# Patient Record
Sex: Female | Born: 1985 | Race: White | Hispanic: No | Marital: Married | Smoking: Never smoker
Health system: Southern US, Community
[De-identification: ages and names within clinical notes are randomized; demographics above are authoritative.]

## PROBLEM LIST (undated history)

## (undated) DIAGNOSIS — F32A Depression, unspecified: Secondary | ICD-10-CM

## (undated) DIAGNOSIS — E785 Hyperlipidemia, unspecified: Secondary | ICD-10-CM

## (undated) DIAGNOSIS — F329 Major depressive disorder, single episode, unspecified: Secondary | ICD-10-CM

## (undated) DIAGNOSIS — G43909 Migraine, unspecified, not intractable, without status migrainosus: Secondary | ICD-10-CM

## (undated) DIAGNOSIS — F419 Anxiety disorder, unspecified: Secondary | ICD-10-CM

## (undated) HISTORY — PX: EXTERNAL EAR SURGERY: SHX627

## (undated) HISTORY — DX: Anxiety disorder, unspecified: F41.9

## (undated) HISTORY — DX: Depression, unspecified: F32.A

## (undated) HISTORY — DX: Major depressive disorder, single episode, unspecified: F32.9

## (undated) HISTORY — PX: ADENOIDECTOMY: SUR15

---

## 2008-07-07 ENCOUNTER — Emergency Department (HOSPITAL_COMMUNITY): Admission: EM | Admit: 2008-07-07 | Discharge: 2008-07-07 | Payer: Self-pay | Admitting: Family Medicine

## 2008-08-02 ENCOUNTER — Other Ambulatory Visit: Admission: RE | Admit: 2008-08-02 | Discharge: 2008-08-02 | Payer: Self-pay | Admitting: Obstetrics and Gynecology

## 2008-08-23 ENCOUNTER — Encounter: Admission: RE | Admit: 2008-08-23 | Discharge: 2008-09-11 | Payer: Self-pay | Admitting: Nurse Practitioner

## 2009-05-15 ENCOUNTER — Encounter: Admission: RE | Admit: 2009-05-15 | Discharge: 2009-06-04 | Payer: Self-pay | Admitting: Family Medicine

## 2009-10-02 ENCOUNTER — Other Ambulatory Visit: Admission: RE | Admit: 2009-10-02 | Discharge: 2009-10-02 | Payer: Self-pay | Admitting: Family Medicine

## 2010-08-08 LAB — GC/CHLAMYDIA PROBE AMP, GENITAL
Chlamydia, DNA Probe: NEGATIVE
GC Probe Amp, Genital: NEGATIVE

## 2010-08-08 LAB — POCT PREGNANCY, URINE: Preg Test, Ur: NEGATIVE

## 2010-08-08 LAB — WET PREP, GENITAL

## 2010-10-16 ENCOUNTER — Other Ambulatory Visit (HOSPITAL_COMMUNITY)
Admission: RE | Admit: 2010-10-16 | Discharge: 2010-10-16 | Disposition: A | Discharge status: Home / Self Care | Payer: 59 | Source: Ambulatory Visit | Attending: Family Medicine | Admitting: Family Medicine

## 2010-10-16 ENCOUNTER — Other Ambulatory Visit: Payer: Self-pay | Admitting: Family Medicine

## 2010-10-16 DIAGNOSIS — Z124 Encounter for screening for malignant neoplasm of cervix: Secondary | ICD-10-CM | POA: Insufficient documentation

## 2011-04-13 ENCOUNTER — Emergency Department (HOSPITAL_COMMUNITY)
Admission: EM | Admit: 2011-04-13 | Discharge: 2011-04-13 | Disposition: A | Discharge status: Home / Self Care | Payer: Worker's Compensation | Attending: Emergency Medicine | Admitting: Emergency Medicine

## 2011-04-13 ENCOUNTER — Encounter: Payer: Self-pay | Admitting: Emergency Medicine

## 2011-04-13 DIAGNOSIS — IMO0001 Reserved for inherently not codable concepts without codable children: Secondary | ICD-10-CM | POA: Insufficient documentation

## 2011-04-13 DIAGNOSIS — Y990 Civilian activity done for income or pay: Secondary | ICD-10-CM | POA: Insufficient documentation

## 2011-04-13 DIAGNOSIS — S61209A Unspecified open wound of unspecified finger without damage to nail, initial encounter: Secondary | ICD-10-CM | POA: Insufficient documentation

## 2011-04-13 DIAGNOSIS — IMO0002 Reserved for concepts with insufficient information to code with codable children: Secondary | ICD-10-CM

## 2011-04-13 DIAGNOSIS — Y9269 Other specified industrial and construction area as the place of occurrence of the external cause: Secondary | ICD-10-CM | POA: Insufficient documentation

## 2011-04-13 HISTORY — DX: Migraine, unspecified, not intractable, without status migrainosus: G43.909

## 2011-04-13 MED ORDER — DOXYCYCLINE HYCLATE 100 MG PO TABS
100.0000 mg | ORAL_TABLET | Freq: Once | ORAL | Status: AC
  Administered 2011-04-13: 100 mg via ORAL
  Filled 2011-04-13: qty 1

## 2011-04-13 MED ORDER — DOXYCYCLINE HYCLATE 100 MG PO TABS: 100.0000 mg | ORAL_TABLET | Freq: Two times a day (BID) | ORAL | Status: AC

## 2011-04-13 MED ORDER — FLUCONAZOLE 150 MG PO TABS: 150.0000 mg | ORAL_TABLET | Freq: Once | ORAL | Status: AC

## 2011-04-13 NOTE — ED Notes (Signed)
1/2 in scratch on inner aspect of 4th finger

## 2011-04-13 NOTE — ED Provider Notes (Signed)
History     CSN: [id number] Arrival date & time: 04/13/2011  9:00 AM   First MD Initiated Contact with Patient 04/13/11 0940      Chief Complaint  Patient presents with  . Animal Bite    cat bite 12 hrs ago, at work   HPI Patient presents to the emergency room with complaint of cat bite that started last night. Reports that she was bit by a cat that the owner believes his rabies shots are up to date. States that he was sent for testing as well. Patient has a prophylactic rabies shot last year. Denies any pain, swelling, warmth or other signs of infection. No fevers or chills. No other complaints.   Past Medical History  Diagnosis Date  . Migraine headache     Past Surgical History  Procedure Date  . Adenoidectomy     Family History  Problem Relation Age of Onset  . Diabetes Mother   . Hypertension Mother   . Migraines Mother     History  Substance Use Topics  . Smoking status: Never Smoker   . Smokeless tobacco: Not on file  . Alcohol Use: Yes    OB History    Grav Para Term Preterm Abortions TAB SAB Ect Mult Living                  Review of Systems  Constitutional: Negative for fever, chills, diaphoresis and appetite change.  HENT: Negative for neck pain.   Eyes: Negative for photophobia and visual disturbance.  Respiratory: Negative for cough, chest tightness and shortness of breath.   Cardiovascular: Negative for chest pain.  Gastrointestinal: Negative for nausea, vomiting and abdominal pain.  Genitourinary: Negative for flank pain.  Musculoskeletal: Negative for back pain.  Skin: Positive for wound. Negative for color change, pallor and rash.  Neurological: Negative for weakness and numbness.  All other systems reviewed and are negative.    Allergies  Amoxicillin and Latex  Home Medications   Current Outpatient Rx  Name Route Sig Dispense Refill  . ACETAMINOPHEN 500 MG PO TABS Oral Take 1,000 mg by mouth every 6 (six) hours as needed. For pain.      Marland Kitchen ETONOGESTREL-ETHINYL ESTRADIOL 0.12-0.015 MG/24HR VA RING Vaginal Place 1 each vaginally every 28 (twenty-eight) days. Insert vaginally and leave in place for 3 consecutive weeks, then remove for 1 week.     . IBUPROFEN 200 MG PO TABS Oral Take 400 mg by mouth every 6 (six) hours as needed. For pain.       BP 100/56  Pulse 66  Temp 98.2 F (36.8 C)  SpO2 100%  LMP 03/30/2011  Physical Exam  Nursing note and vitals reviewed. Constitutional: She is oriented to person, place, and time. She appears well-developed and well-nourished.  Non-toxic appearance. No distress.       Vital signs stable  HENT:  Head: Normocephalic and atraumatic.  Eyes: EOM are normal. Pupils are equal, round, and reactive to light.  Neck: Normal range of motion. Neck supple.  Cardiovascular: Normal rate, regular rhythm, normal heart sounds and intact distal pulses.  Exam reveals no gallop and no friction rub.   No murmur heard. Pulmonary/Chest: Effort normal and breath sounds normal. No respiratory distress. She has no wheezes.  Abdominal: Soft. Bowel sounds are normal. There is no tenderness. There is no rebound and no guarding.  Musculoskeletal: Normal range of motion. She exhibits no edema and no tenderness.  Neurological: She is alert and oriented to  person, place, and time. She exhibits normal muscle tone.  Skin: Skin is warm and dry. No rash noted. She is not diaphoretic.       Small 1/4 inch wound to the right fourth finger, bleeding controlled. No wound to repair, superficial. No signs of infection.  Psychiatric: She has a normal mood and affect. Her behavior is normal. Judgment and thought content normal.    ED Course  Procedures (including critical care time)  Patient seen and evaluated.  VSS reviewed. Nursing notes reviewed. Initial testing ordered. Will monitor the patient closely. They agree with the treatment plan and diagnosis.   No testing needed at this time. Patient advised to follow up  on the cat results for rabies. States that she would not like to be prophylactically treated for rabies at this time. Discussed risks and benefits. Is of sound mind to make this decision. Wound cleansed. Full sensation. Radial pulse intact. Cap refill < 2 seconds. No other complaints. Advised of warning signs to return.    MDM  Cat bite        Demetrius Charity, PA 04/13/11 1050

## 2011-04-13 NOTE — ED Provider Notes (Signed)
I have personally performed and participated in all the services and procedures documented herein. I have reviewed the findings with the patient.   Dione Booze, MD 04/13/11 [phone]

## 2011-04-13 NOTE — ED Notes (Signed)
Pt stated that she received a prophylactic rabies vacc. At work, one year ago

## 2011-04-17 ENCOUNTER — Encounter (HOSPITAL_COMMUNITY): Payer: Self-pay | Admitting: *Deleted

## 2011-04-17 ENCOUNTER — Emergency Department (HOSPITAL_COMMUNITY)
Admission: EM | Admit: 2011-04-17 | Discharge: 2011-04-17 | Disposition: A | Discharge status: Home / Self Care | Payer: Worker's Compensation | Attending: Emergency Medicine | Admitting: Emergency Medicine

## 2011-04-17 DIAGNOSIS — Z09 Encounter for follow-up examination after completed treatment for conditions other than malignant neoplasm: Secondary | ICD-10-CM | POA: Insufficient documentation

## 2011-04-17 DIAGNOSIS — W5501XA Bitten by cat, initial encounter: Secondary | ICD-10-CM

## 2011-04-17 DIAGNOSIS — Y990 Civilian activity done for income or pay: Secondary | ICD-10-CM | POA: Insufficient documentation

## 2011-04-17 DIAGNOSIS — IMO0001 Reserved for inherently not codable concepts without codable children: Secondary | ICD-10-CM | POA: Insufficient documentation

## 2011-04-17 NOTE — ED Notes (Addendum)
Pt was bitten by a cat on 12/15. Came to ED on 12/16, received treatment and were discharge with prescription antibiotics. Pt reports still taking antibiotics. Pt on 12/16 was instructed to come back for reevaluation on Tues 12/18, but pt was unable to, so pt came today to be reevaluated. Pt reports on 12/16 given prescription for Diflucan, but has not started taking it.

## 2011-04-17 NOTE — ED Provider Notes (Signed)
Medical screening examination/treatment/procedure(s) were performed by non-physician practitioner and as supervising physician I was immediately available for consultation/collaboration.    Nelia Shi, MD 04/17/11 2216

## 2011-04-17 NOTE — ED Provider Notes (Signed)
History     CSN: [id number]  Arrival date & time 04/17/11  4098   First MD Initiated Contact with Patient 04/17/11 [phone]      Chief Complaint  Patient presents with  . Wound Check    follow up on cat bite    (Consider location/radiation/quality/duration/timing/severity/associated sxs/prior treatment) Patient is a 25 y.o. female presenting with wound check. The history is provided by the patient.  Wound Check  She was treated in the ED 3 to 5 days ago. Treatments since wound repair include oral antibiotics. The maximum temperature noted was less than 100.4 F. There has been no drainage from the wound. There is no redness present. There is no swelling present. The pain has no pain.  Pt had a cat bite to the left ring finger 4 days ago. Is on antibiotics right now. Bite healed. No redness, swelling, drainage, fever. Was told to come for recheck.  Past Medical History  Diagnosis Date  . Migraine headache   . Asthma     exercise induced    Past Surgical History  Procedure Date  . Adenoidectomy     Family History  Problem Relation Age of Onset  . Diabetes Mother   . Hypertension Mother   . Migraines Mother   . Migraines Brother     History  Substance Use Topics  . Smoking status: Never Smoker   . Smokeless tobacco: Not on file  . Alcohol Use: Yes     occasionally    OB History    Grav Para Term Preterm Abortions TAB SAB Ect Mult Living                  Review of Systems  Constitutional: Negative for fever and chills.  HENT: Negative.   Respiratory: Negative.   Cardiovascular: Negative.   Gastrointestinal: Negative.   Genitourinary: Negative.   Musculoskeletal: Negative.   Skin: Positive for wound.  Neurological: Negative.   Psychiatric/Behavioral: Negative.     Allergies  Amoxicillin and Latex  Home Medications   Current Outpatient Rx  Name Route Sig Dispense Refill  . ACETAMINOPHEN 500 MG PO TABS Oral Take 1,000 mg by mouth every 6 (six) hours as  needed. For pain.     Marland Kitchen DOXYCYCLINE HYCLATE 100 MG PO TABS Oral Take 1 tablet (100 mg total) by mouth 2 (two) times daily. 10 tablet 0  . ETONOGESTREL-ETHINYL ESTRADIOL 0.12-0.015 MG/24HR VA RING Vaginal Place 1 each vaginally every 28 (twenty-eight) days. Insert vaginally and leave in place for 3 consecutive weeks, then remove for 1 week.     . IBUPROFEN 200 MG PO TABS Oral Take 400-600 mg by mouth every 8 (eight) hours as needed. For pain.    Marland Kitchen BLINK TEARS OP Ophthalmic Apply 1 drop to eye as needed. For dry eyes       BP 108/60  Pulse 86  Temp(Src) 98.4 F (36.9 C) (Oral)  Resp 16  Ht 4\' 11"  (1.499 m)  Wt 145 lb (65.772 kg)  BMI 29.29 kg/m2  SpO2 100%  LMP 03/30/2011  Physical Exam  Constitutional: She is oriented to person, place, and time. She appears well-developed and well-nourished. No distress.  Neck: Neck supple.  Cardiovascular: Normal rate, regular rhythm and normal heart sounds.   Pulmonary/Chest: Effort normal and breath sounds normal.  Neurological: She is alert and oriented to person, place, and time.  Skin: Skin is warm and dry.       Healed laceration to the left ring finger,  no swelling, tenderness, erythema, draiange  Psychiatric: She has a normal mood and affect.    ED Course  Procedures (including critical care time)  Pt with cat bite 4 days ago. Was told to return 2 days ago for recheck, but she is 2 days late. At this time the bite/wound is completely healed with no signs of infection. Will d/c home.   MDM          Lottie Mussel, PA 04/17/11 [phone]

## 2011-12-06 ENCOUNTER — Emergency Department
Admission: EM | Admit: 2011-12-06 | Discharge: 2011-12-06 | Disposition: A | Discharge status: Home / Self Care | Payer: 59 | Source: Home / Self Care

## 2011-12-06 DIAGNOSIS — J069 Acute upper respiratory infection, unspecified: Secondary | ICD-10-CM

## 2011-12-06 HISTORY — DX: Hyperlipidemia, unspecified: E78.5

## 2011-12-06 MED ORDER — AZITHROMYCIN 250 MG PO TABS: ORAL_TABLET | ORAL | Status: AC

## 2011-12-06 MED ORDER — BENZONATATE 100 MG PO CAPS: 100.0000 mg | ORAL_CAPSULE | Freq: Three times a day (TID) | ORAL | Status: AC | PRN

## 2011-12-06 NOTE — ED Notes (Signed)
States symptoms started 3 weeks ago, got better for a while now having increase coughing and head congestion

## 2011-12-06 NOTE — ED Provider Notes (Signed)
History     CSN: [id number]  Arrival date & time 12/06/11  1050   First MD Initiated Contact with Patient 12/06/11 1055      Chief Complaint  Patient presents with  . URI   HPI URI Symptoms Onset: 1 week Description: rhinorrhea, nasal congestion, post nasal drip, cough Modifying factors:  Was sick with similar sxs 2 weeks   Symptoms Nasal discharge: yes Fever: no Sore throat: no Cough: yes Wheezing: no Ear pain: mild GI symptoms: no Sick contacts: unknown  Red Flags  Stiff neck: no Dyspnea: no Rash: no Swallowing difficulty: no  Sinusitis Risk Factors Headache/face pain: no Double sickening: possible tooth pain: no  Allergy Risk Factors Sneezing: mild Itchy scratchy throat: mild Seasonal symptoms: no  Flu Risk Factors Headache: no muscle aches: no severe fatigue: no   Past Medical History  Diagnosis Date  . Migraine headache   . Asthma     exercise induced  . Hyperlipidemia     Past Surgical History  Procedure Date  . Adenoidectomy     Family History  Problem Relation Age of Onset  . Diabetes Mother   . Hypertension Mother   . Migraines Mother   . Migraines Brother     History  Substance Use Topics  . Smoking status: Never Smoker   . Smokeless tobacco: Not on file  . Alcohol Use: Yes     occasionally    OB History    Grav Para Term Preterm Abortions TAB SAB Ect Mult Living                  Review of Systems  All other systems reviewed and are negative.    Allergies  Amoxicillin and Latex  Home Medications   Current Outpatient Rx  Name Route Sig Dispense Refill  . ACETAMINOPHEN 500 MG PO TABS Oral Take 1,000 mg by mouth every 6 (six) hours as needed. For pain.     Marland Kitchen ETONOGESTREL-ETHINYL ESTRADIOL 0.12-0.015 MG/24HR VA RING Vaginal Place 1 each vaginally every 28 (twenty-eight) days. Insert vaginally and leave in place for 3 consecutive weeks, then remove for 1 week.     . IBUPROFEN 200 MG PO TABS Oral Take 400-600 mg by  mouth every 8 (eight) hours as needed. For pain.    Marland Kitchen BLINK TEARS OP Ophthalmic Apply 1 drop to eye as needed. For dry eyes       BP 105/75  Pulse 86  Temp 98.2 F (36.8 C) (Oral)  Resp 16  Ht 5' (1.524 m)  Wt 148 lb (67.132 kg)  BMI 28.90 kg/m2  SpO2 99%  LMP 11/12/2011  Physical Exam  Constitutional: She appears well-developed and well-nourished.  HENT:  Head: Normocephalic and atraumatic.  Right Ear: External ear normal.  Left Ear: External ear normal.       +nasal erythema, rhinorrhea bilaterally, + post oropharyngeal erythema    Eyes: Conjunctivae are normal. Pupils are equal, round, and reactive to light.  Neck: Normal range of motion. Neck supple.  Cardiovascular: Normal rate and regular rhythm.   Pulmonary/Chest: Effort normal and breath sounds normal.  Abdominal: Soft. Bowel sounds are normal.  Musculoskeletal: Normal range of motion.  Skin: Skin is warm.    ED Course  Procedures (including critical care time)  Labs Reviewed - No data to display No results found.   1. URI (upper respiratory infection)       MDM  Likely viral in etiology.  Given duration of sxs and overall  disease course, will treat with azithromycin for broad spectrum coverage.  Tessalon perles for cough.  Discussed infectious red flags.  Handout given.  Follow up as needed.      The patient and/or caregiver has been counseled thoroughly with regard to treatment plan and/or medications prescribed including dosage, schedule, interactions, rationale for use, and possible side effects and they verbalize understanding. Diagnoses and expected course of recovery discussed and will return if not improved as expected or if the condition worsens. Patient and/or caregiver verbalized understanding.             Floydene Flock, MD 12/06/11 1144  Floydene Flock, MD 12/06/11 1145

## 2011-12-11 NOTE — ED Provider Notes (Signed)
Agree with exam, assessment, and plan.   Lattie Haw, MD 12/11/11 [phone]

## 2012-01-01 ENCOUNTER — Other Ambulatory Visit (HOSPITAL_COMMUNITY)
Admission: RE | Admit: 2012-01-01 | Discharge: 2012-01-01 | Disposition: A | Discharge status: Home / Self Care | Payer: 59 | Source: Ambulatory Visit | Attending: Family Medicine | Admitting: Family Medicine

## 2012-01-01 ENCOUNTER — Other Ambulatory Visit: Payer: Self-pay | Admitting: Family Medicine

## 2012-01-01 DIAGNOSIS — Z124 Encounter for screening for malignant neoplasm of cervix: Secondary | ICD-10-CM | POA: Insufficient documentation

## 2012-11-27 ENCOUNTER — Emergency Department (HOSPITAL_COMMUNITY)
Admission: EM | Admit: 2012-11-27 | Discharge: 2012-11-27 | Disposition: A | Discharge status: Home / Self Care | Payer: 59 | Attending: Emergency Medicine | Admitting: Emergency Medicine

## 2012-11-27 ENCOUNTER — Emergency Department (HOSPITAL_COMMUNITY): Payer: 59

## 2012-11-27 ENCOUNTER — Encounter (HOSPITAL_COMMUNITY): Payer: Self-pay | Admitting: *Deleted

## 2012-11-27 DIAGNOSIS — R109 Unspecified abdominal pain: Secondary | ICD-10-CM | POA: Insufficient documentation

## 2012-11-27 DIAGNOSIS — Z79899 Other long term (current) drug therapy: Secondary | ICD-10-CM | POA: Insufficient documentation

## 2012-11-27 DIAGNOSIS — Z91040 Latex allergy status: Secondary | ICD-10-CM | POA: Insufficient documentation

## 2012-11-27 DIAGNOSIS — J45909 Unspecified asthma, uncomplicated: Secondary | ICD-10-CM | POA: Insufficient documentation

## 2012-11-27 DIAGNOSIS — R110 Nausea: Secondary | ICD-10-CM | POA: Insufficient documentation

## 2012-11-27 DIAGNOSIS — Z3202 Encounter for pregnancy test, result negative: Secondary | ICD-10-CM | POA: Insufficient documentation

## 2012-11-27 DIAGNOSIS — Z8679 Personal history of other diseases of the circulatory system: Secondary | ICD-10-CM | POA: Insufficient documentation

## 2012-11-27 DIAGNOSIS — Z9889 Other specified postprocedural states: Secondary | ICD-10-CM | POA: Insufficient documentation

## 2012-11-27 DIAGNOSIS — Z8639 Personal history of other endocrine, nutritional and metabolic disease: Secondary | ICD-10-CM | POA: Insufficient documentation

## 2012-11-27 DIAGNOSIS — Z862 Personal history of diseases of the blood and blood-forming organs and certain disorders involving the immune mechanism: Secondary | ICD-10-CM | POA: Insufficient documentation

## 2012-11-27 LAB — URINALYSIS W MICROSCOPIC + REFLEX CULTURE
Bilirubin Urine: NEGATIVE
Leukocytes, UA: NEGATIVE
Nitrite: NEGATIVE
Specific Gravity, Urine: 1.012 (ref 1.005–1.030)
Urobilinogen, UA: 0.2 mg/dL (ref 0.0–1.0)

## 2012-11-27 LAB — WET PREP, GENITAL
Clue Cells Wet Prep HPF POC: NONE SEEN
Trich, Wet Prep: NONE SEEN

## 2012-11-27 LAB — COMPREHENSIVE METABOLIC PANEL
Albumin: 4 g/dL (ref 3.5–5.2)
Alkaline Phosphatase: 57 U/L (ref 39–117)
BUN: 10 mg/dL (ref 6–23)
Calcium: 9.5 mg/dL (ref 8.4–10.5)
Creatinine, Ser: 0.53 mg/dL (ref 0.50–1.10)
GFR calc Af Amer: >90 mL/min (ref >90)
Glucose, Bld: 92 mg/dL (ref 70–99)
Total Protein: 6.7 g/dL (ref 6.0–8.3)

## 2012-11-27 LAB — PREGNANCY, URINE: Preg Test, Ur: NEGATIVE

## 2012-11-27 LAB — CBC WITH DIFFERENTIAL/PLATELET
Basophils Relative: 0 % (ref 0–1)
Eosinophils Absolute: 0.1 10*3/uL (ref 0.0–0.7)
Eosinophils Relative: 1 % (ref 0–5)
Hemoglobin: 12.1 g/dL (ref 12.0–15.0)
Lymphs Abs: 3.9 10*3/uL (ref 0.7–4.0)
MCH: 33.3 pg (ref 26.0–34.0)
MCHC: 34.4 g/dL (ref 30.0–36.0)
MCV: 97 fL (ref 78.0–100.0)
Monocytes Absolute: 0.4 10*3/uL (ref 0.1–1.0)
Monocytes Relative: 6 % (ref 3–12)
RBC: 3.63 MIL/uL — ABNORMAL LOW (ref 3.87–5.11)

## 2012-11-27 LAB — LIPASE, BLOOD: Lipase: 25 U/L (ref 11–59)

## 2012-11-27 MED ORDER — IOHEXOL 300 MG/ML  SOLN
50.0000 mL | Freq: Once | INTRAMUSCULAR | Status: AC | PRN
  Administered 2012-11-27: 50 mL via ORAL

## 2012-11-27 MED ORDER — IOHEXOL 300 MG/ML  SOLN
100.0000 mL | Freq: Once | INTRAMUSCULAR | Status: AC | PRN
  Administered 2012-11-27: 100 mL via INTRAVENOUS

## 2012-11-27 MED ORDER — DICYCLOMINE HCL 20 MG PO TABS: 20.0000 mg | ORAL_TABLET | Freq: Two times a day (BID) | ORAL | Status: DC

## 2012-11-27 NOTE — ED Notes (Signed)
Complains of abdominal pain that initiated on the right abdomen below the umbilicus that is now diffuse throughout the abdomen. Also complains of dizziness especially with movement. Acknowledges nausea but denies  Vomiting.

## 2012-11-27 NOTE — ED Provider Notes (Signed)
CSN: [id number]     Arrival date & time 11/27/12  1740 History     First MD Initiated Contact with Patient 11/27/12 1800     Chief Complaint  Patient presents with  . Abdominal Pain   (Consider location/radiation/quality/duration/timing/severity/associated sxs/prior Treatment) HPI Gloria Barnett is a 27 y.o. female who presents to ED with complaint of abdominal pain. States pain began 2 days ago. States began in the right side. Pain is now mainly in the lower abdomen, states "spread all over." Pt was seen by her PCP yesterday, had labs ordered, results of which she does not know. Also had [place] ordered to "r/o gall bladder, r/o appendicitis, r/o IUD placement." Pt states she was unable to get [place] because "system was down." States pain is now worsening. Describes pain as dull. Worsened with position and movement. Admits to nausea, no vomiting. NO fever. Normal appetite. No urinary or vaginal symptoms. Last bowel movement today, and normal.  Past Medical History  Diagnosis Date  . Migraine headache   . Asthma     exercise induced  . Hyperlipidemia    Past Surgical History  Procedure Laterality Date  . Adenoidectomy     Family History  Problem Relation Age of Onset  . Diabetes Mother   . Hypertension Mother   . Migraines Mother   . Migraines Brother    History  Substance Use Topics  . Smoking status: Never Smoker   . Smokeless tobacco: Not on file  . Alcohol Use: Yes     Comment: occasionally   OB History   Grav Para Term Preterm Abortions TAB SAB Ect Mult Living                 Review of Systems  Constitutional: Negative for fever and chills.  HENT: Negative for neck pain and neck stiffness.   Respiratory: Negative.   Cardiovascular: Negative.   Gastrointestinal: Positive for nausea and abdominal pain.  Genitourinary: Negative for dysuria, vaginal bleeding, vaginal discharge, difficulty urinating and pelvic pain.  Musculoskeletal: Negative for myalgias.  Skin: Negative.    Neurological: Negative for weakness and numbness.    Allergies  Amoxicillin and Latex  Home Medications   Current Outpatient Rx  Name  Route  Sig  Dispense  Refill  . albuterol (PROVENTIL HFA;VENTOLIN HFA) 108 (90 BASE) MCG/ACT inhaler   Inhalation   Inhale 2 puffs into the lungs every 6 (six) hours as needed for wheezing or shortness of breath.         . citalopram (CELEXA) 10 MG tablet   Oral   Take 10 mg by mouth daily.         Marland Kitchen ibuprofen (ADVIL,MOTRIN) 200 MG tablet   Oral   Take 400 mg by mouth every 8 (eight) hours as needed for pain.          . montelukast (SINGULAIR) 10 MG tablet   Oral   Take 10 mg by mouth every morning.          BP 115/73  Pulse 80  Temp(Src) 98.3 F (36.8 C) (Oral)  Resp 20  SpO2 100% Physical Exam  Nursing note and vitals reviewed. Constitutional: She is oriented to person, place, and time. She appears well-developed and well-nourished. No distress.  Eyes: Conjunctivae are normal.  Neck: Neck supple.  Cardiovascular: Normal rate, regular rhythm and normal heart sounds.   Pulmonary/Chest: Effort normal and breath sounds normal. No respiratory distress. She has no wheezes. She has no rales.  Abdominal: Soft.  Bowel sounds are normal. She exhibits no distension. There is tenderness. There is no rebound and no guarding.  RLQ, LLQ, suprapubic tenderness  Genitourinary:  Normal external genitalia. Normal vaginal canal, white thin discharge. Normal cervix, IUD strings seen. No CMT, no adnexal tenderness. No uterine tenderness.   Musculoskeletal: She exhibits no edema.  Neurological: She is alert and oriented to person, place, and time.  Skin: Skin is warm and dry.  Psychiatric: She has a normal mood and affect. Her behavior is normal.    ED Course   Procedures (including critical care time)  Results for orders placed during the hospital encounter of 11/27/12  WET PREP, GENITAL      Result Value Range   Yeast Wet Prep HPF POC  NONE SEEN  NONE SEEN   Trich, Wet Prep NONE SEEN  NONE SEEN   Clue Cells Wet Prep HPF POC NONE SEEN  NONE SEEN   WBC, Wet Prep HPF POC FEW (*) NONE SEEN  CBC WITH DIFFERENTIAL      Result Value Range   WBC 6.5  4.0 - 10.5 K/uL   RBC 3.63 (*) 3.87 - 5.11 MIL/uL   Hemoglobin 12.1  12.0 - 15.0 g/dL   HCT 91.4 (*) 78.2 - 95.6 %   MCV 97.0  78.0 - 100.0 fL   MCH 33.3  26.0 - 34.0 pg   MCHC 34.4  30.0 - 36.0 g/dL   RDW 21.3  08.6 - 57.8 %   Platelets 198  150 - 400 K/uL   Neutrophils Relative % 33 (*) 43 - 77 %   Neutro Abs 2.1  1.7 - 7.7 K/uL   Lymphocytes Relative 60 (*) 12 - 46 %   Lymphs Abs 3.9  0.7 - 4.0 K/uL   Monocytes Relative 6  3 - 12 %   Monocytes Absolute 0.4  0.1 - 1.0 K/uL   Eosinophils Relative 1  0 - 5 %   Eosinophils Absolute 0.1  0.0 - 0.7 K/uL   Basophils Relative 0  0 - 1 %   Basophils Absolute 0.0  0.0 - 0.1 K/uL  COMPREHENSIVE METABOLIC PANEL      Result Value Range   Sodium 134 (*) 135 - 145 mEq/L   Potassium 3.6  3.5 - 5.1 mEq/L   Chloride 101  96 - 112 mEq/L   CO2 24  19 - 32 mEq/L   Glucose, Bld 92  70 - 99 mg/dL   BUN 10  6 - 23 mg/dL   Creatinine, Ser 4.69  0.50 - 1.10 mg/dL   Calcium 9.5  8.4 - 62.9 mg/dL   Total Protein 6.7  6.0 - 8.3 g/dL   Albumin 4.0  3.5 - 5.2 g/dL   AST 15  0 - 37 U/L   ALT 12  0 - 35 U/L   Alkaline Phosphatase 57  39 - 117 U/L   Total Bilirubin 0.3  0.3 - 1.2 mg/dL   GFR calc non Af Amer >90  >90 mL/min   GFR calc Af Amer >90  >90 mL/min  LIPASE, BLOOD      Result Value Range   Lipase 25  11 - 59 U/L  URINALYSIS W MICROSCOPIC + REFLEX CULTURE      Result Value Range   Color, Urine YELLOW  YELLOW   APPearance CLEAR  CLEAR   Specific Gravity, Urine 1.012  1.005 - 1.030   pH 6.0  5.0 - 8.0   Glucose, UA NEGATIVE  NEGATIVE  mg/dL   Hgb urine dipstick NEGATIVE  NEGATIVE   Bilirubin Urine NEGATIVE  NEGATIVE   Ketones, ur NEGATIVE  NEGATIVE mg/dL   Protein, ur NEGATIVE  NEGATIVE mg/dL   Urobilinogen, UA 0.2  0.0 - 1.0  mg/dL   Nitrite NEGATIVE  NEGATIVE   Leukocytes, UA NEGATIVE  NEGATIVE   Urine-Other       Value: NO FORMED ELEMENTS SEEN ON URINE MICROSCOPIC EXAMINATION  PREGNANCY, URINE      Result Value Range   Preg Test, Ur NEGATIVE  NEGATIVE   Ct Abdomen Pelvis W Contrast  11/27/2012   *RADIOLOGY REPORT*  Clinical Data:   Abdominal pain.  CT ABDOMEN AND PELVIS WITH CONTRAST  Technique:  Multidetector CT imaging of the abdomen and pelvis was performed following the standard protocol during bolus administration of intravenous contrast.  Contrast: OMNIPAQUE IOHEXOL 300 MG/ML  SOLN  Comparison: None.  Findings:  BODY WALL: Unremarkable.  LOWER CHEST:  Mediastinum: Unremarkable.  Lungs/pleura: No consolidation.  ABDOMEN/PELVIS:  Liver: Incidental sub centimeter subcapsular low attenuation focus along segment II, fat infiltration around the falciform ligament.  Biliary: No evidence of biliary obstruction or stone.  Pancreas: Unremarkable.  Spleen: Unremarkable.  Adrenals: Unremarkable.  Kidneys and ureters: No hydronephrosis or stone.  Bladder: Unremarkable.  Bowel: No obstruction. Normal appendix. Minimal fat haziness around the cecum, is likely within normal limits given the cecum is otherwise unremarkable.  Retroperitoneum: No mass or adenopathy.  Peritoneum: Small-volume free fluid in the pelvis, measuring water density.  Reproductive: Right-sided corpus luteal cyst. There is an IUD, with crossbar 11 mm from the from the serosal fundus.  This side arms are fully deployed and oriented appropriately.  The right side arm extends to within 3 mm of the myometrial surface.  Vascular: No acute abnormality.  OSSEOUS: No acute abnormalities. No suspicious lytic or blastic lesions.  IMPRESSION: 1.  No acute intra-abdominal findings.  No appendicitis. 2.  Free pelvic fluid, usually physiologic.   Original Report Authenticated By: Tiburcio Pea     1. Abdominal pain     MDM  PT with abdominal pain for 3 days,  diffuse, lower abdomen. Exam positive for lower abdominal tenderness with no guarding, no rebound tenderness. Pelvic exam unremarkable. Urine preg negative. UA negative. Pt has no RUQ tenderness. Given exam and 2nd visit for same pain, CT obtained to r/o appendicitis. CT is negative. Pt's VS normal. She does not have an acute abdomen on exam. She is non toxic appearing. Will try bentyl, pain management at home. Follow up with PCP.   Filed Vitals:   11/27/12 1748 11/27/12 2249  BP: 115/73 103/64  Pulse: 80 67  Temp: 98.3 F (36.8 C)   TempSrc: Oral   Resp: 20 18  SpO2: 100% 100%     Ymani Porcher A Enslie Sahota, PA-C 11/28/12 0008

## 2012-11-27 NOTE — ED Notes (Signed)
Pt reports onset of abd pain yesterday. Pain was on right side of abd yesterday, pt saw her pcp at [facility] - ordered for pt to have ultrasound but computers were down. Pt's urinalysis was negative, cbc and cmp have not resulted. Pain has spread all over abd, pt c/o nausea. Ultrasound to r/o gallstone disease, appendicitis and evaluate IUD placement

## 2012-11-28 NOTE — ED Provider Notes (Signed)
Medical screening examination/treatment/procedure(s) were performed by non-physician practitioner and as supervising physician I was immediately available for consultation/collaboration.    Raschelle Wisenbaker R Sebasthian Stailey, MD 11/28/12 0018 

## 2013-05-06 ENCOUNTER — Encounter: Payer: Self-pay | Admitting: Neurology

## 2013-05-09 ENCOUNTER — Ambulatory Visit: Payer: 59 | Admitting: Neurology

## 2013-05-13 ENCOUNTER — Ambulatory Visit (INDEPENDENT_AMBULATORY_CARE_PROVIDER_SITE_OTHER): Payer: 59 | Admitting: Neurology

## 2013-05-13 ENCOUNTER — Encounter: Payer: Self-pay | Admitting: Neurology

## 2013-05-13 VITALS — BP 101/63 | HR 60 | Ht 62.0 in | Wt 154.0 lb

## 2013-05-13 DIAGNOSIS — G43909 Migraine, unspecified, not intractable, without status migrainosus: Secondary | ICD-10-CM | POA: Insufficient documentation

## 2013-05-13 DIAGNOSIS — F419 Anxiety disorder, unspecified: Secondary | ICD-10-CM | POA: Insufficient documentation

## 2013-05-13 DIAGNOSIS — F411 Generalized anxiety disorder: Secondary | ICD-10-CM

## 2013-05-13 DIAGNOSIS — E785 Hyperlipidemia, unspecified: Secondary | ICD-10-CM | POA: Insufficient documentation

## 2013-05-13 MED ORDER — NORTRIPTYLINE HCL 10 MG PO CAPS: ORAL_CAPSULE | ORAL | Status: DC

## 2013-05-13 MED ORDER — RIZATRIPTAN BENZOATE 10 MG PO TBDP: 10.0000 mg | ORAL_TABLET | ORAL | Status: AC | PRN

## 2013-05-13 NOTE — Progress Notes (Signed)
[facility]  PATIENT: Gloria Barnett DOB: 1985/12/10  HISTORICAL  Gloria Barnett is a 28 year old right-handed female, was referred by her primary care physician Dr. Cleda Mccreedy, and ENT Dr. Pollyann Kennedy for evaluation of headaches.  She has past medical history of migraines since college, her typical migraines are bilateral retro-orbital area severe pounding headaches, was associated light noise sensitivity, worsening by movement, lasting 8-12 hours, getting better by lying down in dark quiet room,  Trigger for her migraines are stress, sleep deprivation, hungry  Her mother, and brother suffer jiggle migraines as well  She has changed to a shift to work since April 2014, she began to have frequent headaches since then, she works from midnight to 9 AM, she is not having 2-3 severe migraine headaches each months   Topamax was helping her headaches, but she complains of side effect, such as slow reaction, numbness tingling of her fingertips, tasted funny  REVIEW OF SYSTEMS: Full 14 system review of systems performed and notable only for fatigue, eye pain, cough, headaches, energy, insomnia, sleepiness, as she to work, depression, anxiety, not enough sleep, decreased energy  ALLERGIES: Allergies  Allergen Reactions  . Amoxicillin Rash  . Latex Rash    HOME MEDICATIONS: Outpatient Prescriptions Prior to Visit  Medication Sig Dispense Refill  . albuterol (PROVENTIL HFA;VENTOLIN HFA) 108 (90 BASE) MCG/ACT inhaler Inhale 2 puffs into the lungs every 6 (six) hours as needed for wheezing or shortness of breath.      . citalopram (CELEXA) 10 MG tablet Take 10 mg by mouth daily.      Marland Kitchen ibuprofen (ADVIL,MOTRIN) 200 MG tablet Take 400 mg by mouth every 8 (eight) hours as needed for pain.       . Cetirizine HCl (ZYRTEC ALLERGY PO) Take by mouth.      . dicyclomine (BENTYL) 20 MG tablet Take 1 tablet (20 mg total) by mouth 2 (two) times daily.  20 tablet  0  . montelukast (SINGULAIR) 10  MG tablet Take 10 mg by mouth every morning.         PAST MEDICAL HISTORY: Past Medical History  Diagnosis Date  . Migraine headache   . Asthma     exercise induced  . Hyperlipidemia   . Depression   . Anxiety     PAST SURGICAL HISTORY: Past Surgical History  Procedure Laterality Date  . Adenoidectomy    . External ear surgery      FAMILY HISTORY: Family History  Problem Relation Age of Onset  . Diabetes Maternal Grandmother   . Hypertension Maternal Grandmother   . Migraines Mother   . Migraines Brother   . Heart disease      grandmother, cardiac disorder  . Osteoporosis      grandmother    SOCIAL HISTORY:  History   Social History  . Marital Status: Married    Spouse Name: Gloria Barnett    Number of Children: 0  . Years of Education: BS   Occupational History    [employer],    Social History Main Topics  . Smoking status: Never Smoker   . Smokeless tobacco: Never Used  . Alcohol Use: 0.0 oz/week     Comment: 2 drinks daily or fewer  . Drug Use: No  . Sexual Activity: Yes    Birth Control/ Protection: IUD   Other Topics Concern  . Not on file   Social History Narrative   Patient lives at home withher husband Gloria Barnett)   Patient works full time  [employer]   Education college - BS   Caffeine consumption is 2 cups daily   Right handed.     PHYSICAL EXAM   Filed Vitals:   05/13/13 1015  BP: 101/63  Pulse: 60  Height: 5\' 2"  (1.575 m)  Weight: 154 lb (69.854 kg)     Body mass index is 28.16 kg/(m^2).   Generalized: In no acute distress  Neck: Supple, no carotid bruits   Cardiac: Regular rate rhythm  Pulmonary: Clear to auscultation bilaterally  Musculoskeletal: No deformity  Neurological examination  Mentation: Alert oriented to time, place, history taking, and causual conversation  Cranial nerve II-XII: Pupils were equal round reactive to light extraocular movements were full, Visual field were full on confrontational  test. Bilateral fundi were sharp.  Facial sensation and strength were normal. Hearing was intact to finger rubbing bilaterally. Uvula tongue midline.  head turning and shoulder shrug and were normal and symmetric.Tongue protrusion into cheek strength was normal.  Motor: normal tone, bulk and strength.  Sensory: Intact to fine touch, pinprick, preserved vibratory sensation, and proprioception at toes.  Coordination: Normal finger to nose, heel-to-shin bilaterally there was no truncal ataxia  Gait: Rising up from seated position without assistance, normal stance, without trunk ataxia, moderate stride, good arm swing, smooth turning, able to perform tiptoe, and heel walking without difficulty.   Romberg signs: Negative  Deep tendon reflexes: Brachioradialis 2/2, biceps 2/2, triceps 2/2, patellar 2/2, Achilles 2/2, plantar responses were flexor bilaterally.   DIAGNOSTIC DATA (LABS, IMAGING, TESTING) - I reviewed patient records, labs, notes, testing and imaging myself where available.  Lab Results  Component Value Date   WBC 6.5 11/27/2012   HGB 12.1 11/27/2012   HCT 35.2* 11/27/2012   MCV 97.0 11/27/2012   PLT 198 11/27/2012      Component Value Date/Time   NA 134* 11/27/2012 1839   K 3.6 11/27/2012 1839   CL 101 11/27/2012 1839   CO2 24 11/27/2012 1839   GLUCOSE 92 11/27/2012 1839   BUN 10 11/27/2012 1839   CREATININE 0.53 11/27/2012 1839   CALCIUM 9.5 11/27/2012 1839   PROT 6.7 11/27/2012 1839   ALBUMIN 4.0 11/27/2012 1839   AST 15 11/27/2012 1839   ALT 12 11/27/2012 1839   ALKPHOS 57 11/27/2012 1839   BILITOT 0.3 11/27/2012 1839   GFRNONAA >90 11/27/2012 1839   GFRAA >90 11/27/2012 1839    ASSESSMENT AND PLAN   28 years old right-handed female, presenting with frequent migraines, likely due to her shift work, could not tolerate Topamax due to the side effect   1. nortriptyline 10 mg every night titrating 20 mg every night as preventive medications 2 Maxalt as needed 3. Return to clinic with Gloria Barnett in 3-4  months       Levert FeinsteinYijun Quasean Frye, M.D. Ph.D.  [facility] [address], [address] [place], [place] [address] [phone]

## 2013-08-30 ENCOUNTER — Encounter: Payer: Self-pay | Admitting: Nurse Practitioner

## 2013-08-30 ENCOUNTER — Ambulatory Visit (INDEPENDENT_AMBULATORY_CARE_PROVIDER_SITE_OTHER): Payer: 59 | Admitting: Nurse Practitioner

## 2013-08-30 VITALS — BP 102/66 | HR 73 | Ht 60.5 in | Wt 162.0 lb

## 2013-08-30 DIAGNOSIS — F411 Generalized anxiety disorder: Secondary | ICD-10-CM

## 2013-08-30 DIAGNOSIS — F419 Anxiety disorder, unspecified: Secondary | ICD-10-CM

## 2013-08-30 DIAGNOSIS — G43909 Migraine, unspecified, not intractable, without status migrainosus: Secondary | ICD-10-CM

## 2013-08-30 NOTE — Patient Instructions (Signed)
Continue nortriptyline 20 mg at bedtime Given the information on foods and  other triggers for migraine Given general information on keeping a diary, stress release techniques etc. Followup in 6-8 months

## 2013-08-30 NOTE — Progress Notes (Signed)
[facility]  PATIENT: Gloria Barnett DOB: 10/28/1985   REASON FOR VISIT: Followup for migraine   HISTORY OF PRESENT ILLNESS: Gloria Barnett, 28 year old female returns for followup. She has a history of migraines and was initially evaluated by Dr. Terrace Barnett 05/14/2011. She was placed on nortriptyline at that time and has done well. She also stopped her Tylenol over-the-counter. She has Maxalt to take prn. She is not aware of any specific triggers. There has been no change in pattern of her headaches. She returns for reevaluation   HISTORY:  Migraines since college, her typical migraines are bilateral retro-orbital area severe pounding headaches, was associated light noise sensitivity, worsening by movement, lasting 8-12 hours, getting better by lying down in dark quiet room,  Trigger for her migraines are stress, sleep deprivation, hungry  Her mother, and brother suffer jiggle migraines as well  She has changed to a shift to work since April 2014, she began to have frequent headaches since then, she works from midnight to 9 AM, she is not having 2-3 severe migraine headaches each months  Topamax was helping her headaches, but she complains of side effect, such as slow reaction, numbness tingling of her fingertips, tasted funny  REVIEW OF SYSTEMS: Full 14 system review of systems performed and notable only for those listed, all others are neg:  Constitutional: N/A  Cardiovascular: N/A  Ear/Nose/Throat: N/A  Skin: N/A  Eyes: N/A  Respiratory: N/A  Gastroitestinal: N/A  Hematology/Lymphatic: N/A  Endocrine: N/A Musculoskeletal: Back pain  Allergy/Immunology: N/A  Neurological: Headache  Psychiatric: Anxiety, depression Sleep : NA   ALLERGIES: Allergies  Allergen Reactions  . Amoxicillin Rash  . Latex Rash    HOME MEDICATIONS: Outpatient Prescriptions Prior to Visit  Medication Sig Dispense Refill  . albuterol (PROVENTIL HFA;VENTOLIN HFA) 108 (90 BASE) MCG/ACT  inhaler Inhale 2 puffs into the lungs every 6 (six) hours as needed for wheezing or shortness of breath.      . citalopram (CELEXA) 10 MG tablet Take 10 mg by mouth daily.      Marland Kitchen. ibuprofen (ADVIL,MOTRIN) 200 MG tablet Take 400 mg by mouth every 8 (eight) hours as needed for pain.       . nortriptyline (PAMELOR) 10 MG capsule One po qhs xone week, then 2 tabs po qhs  60 capsule  12  . rizatriptan (MAXALT-MLT) 10 MG disintegrating tablet Take 1 tablet (10 mg total) by mouth as needed for migraine. May repeat in 2 hours if needed  15 tablet  12   No facility-administered medications prior to visit.    PAST MEDICAL HISTORY: Past Medical History  Diagnosis Date  . Migraine headache   . Asthma     exercise induced  . Hyperlipidemia   . Depression   . Anxiety     PAST SURGICAL HISTORY: Past Surgical History  Procedure Laterality Date  . Adenoidectomy    . External ear surgery      FAMILY HISTORY: Family History  Problem Relation Age of Onset  . Diabetes Maternal Grandmother   . Hypertension Maternal Grandmother   . Migraines Mother   . Migraines Brother   . Heart disease      grandmother, cardiac disorder  . Osteoporosis      grandmother    SOCIAL HISTORY: History   Social History  . Marital Status: Married    Spouse Name: Gloria Barnett    Number of Children: 0  . Years of Education: BS   Occupational History  .  [employer]   Social History Main Topics  . Smoking status: Never Smoker   . Smokeless tobacco: Never Used  . Alcohol Use: 0.0 oz/week     Comment: 2 drinks daily or fewer  . Drug Use: No  . Sexual Activity: Yes    Birth Control/ Protection: IUD   Other Topics Concern  . Not on file   Social History Narrative   Patient lives at home withher husband Gloria Hailey(Dust[place])   Patient works full time [employer]   Education college - BS   Caffeine consumption is 2 cups daily   Right handed.     PHYSICAL EXAM  Filed Vitals:   08/30/13 0957  BP:  102/66  Pulse: 73  Height: 5' 0.5" (1.537 m)  Weight: 162 lb (73.483 kg)   Body mass index is 31.11 kg/(m^2).  Generalized: Well developed, obese female in no acute distress  Head: normocephalic and atraumatic,. Oropharynx benign  Neck: Supple, no carotid bruits  Cardiac: Regular rate rhythm, no murmur  Musculoskeletal: No deformity   Neurological examination   Mentation: Alert oriented to time, place, history taking. Follows all commands speech and language fluent  Cranial nerve II-XII: Pupils were equal round reactive to light extraocular movements were full, visual field were full on confrontational test. Facial sensation and strength were normal. hearing was intact to finger rubbing bilaterally. Uvula tongue midline. head turning and shoulder shrug were normal and symmetric.Tongue protrusion into cheek strength was normal. Motor: normal bulk and tone, full strength in the BUE, BLE, fine finger movements normal, no pronator drift. No focal weakness Coordination: finger-nose-finger, heel-to-shin bilaterally, no dysmetria Reflexes: Brachioradialis 2/2, biceps 2/2, triceps 2/2, patellar 2/2, Achilles 2/2, plantar responses were flexor bilaterally. Gait and Station: Rising up from seated position without assistance, normal stance,  moderate stride, good arm swing, smooth turning, able to perform tiptoe, and heel walking without difficulty. Tandem gait is steady  DIAGNOSTIC DATA (LABS, IMAGING, TESTING) - I reviewed patient records, labs, notes, testing and imaging myself where available.  Lab Results  Component Value Date   WBC 6.5 11/27/2012   HGB 12.1 11/27/2012   HCT 35.2* 11/27/2012   MCV 97.0 11/27/2012   PLT 198 11/27/2012      Component Value Date/Time   NA 134* 11/27/2012 1839   K 3.6 11/27/2012 1839   CL 101 11/27/2012 1839   CO2 24 11/27/2012 1839   GLUCOSE 92 11/27/2012 1839   BUN 10 11/27/2012 1839   CREATININE 0.53 11/27/2012 1839   CALCIUM 9.5 11/27/2012 1839   PROT 6.7 11/27/2012 1839     ALBUMIN 4.0 11/27/2012 1839   AST 15 11/27/2012 1839   ALT 12 11/27/2012 1839   ALKPHOS 57 11/27/2012 1839   BILITOT 0.3 11/27/2012 1839   GFRNONAA >90 11/27/2012 1839   GFRAA >90 11/27/2012 1839      ASSESSMENT AND PLAN  28 y.o. year old female  has a past medical history of Migraine headache; Asthma; Hyperlipidemia; Depression; and Anxiety. here to followup for her headaches. She is currently doing well on nortriptyline 20 mg at bedtime. She is sleeping well   Continue nortriptyline 20 mg at bedtime, does not need refills Given the information on foods and  other triggers for migraine Given general information on keeping a diary, stress release techniques etc. Followup in 6-8 months Nilda RiggsNancy Carolyn Martin, [facility] CenterGNP, [facility], APRN  [facility] [address], [address] [place], [place] [address] [phone]

## 2014-03-01 ENCOUNTER — Other Ambulatory Visit: Payer: Self-pay | Admitting: Orthopedic Surgery

## 2014-03-01 DIAGNOSIS — M5442 Lumbago with sciatica, left side: Secondary | ICD-10-CM

## 2014-03-02 ENCOUNTER — Ambulatory Visit
Admission: RE | Admit: 2014-03-02 | Discharge: 2014-03-02 | Disposition: A | Discharge status: Home / Self Care | Payer: 59 | Source: Ambulatory Visit | Attending: Orthopedic Surgery | Admitting: Orthopedic Surgery

## 2014-03-02 ENCOUNTER — Ambulatory Visit: Payer: Self-pay | Admitting: Adult Health

## 2014-03-02 DIAGNOSIS — M5442 Lumbago with sciatica, left side: Secondary | ICD-10-CM

## 2014-03-08 ENCOUNTER — Other Ambulatory Visit: Payer: Self-pay | Admitting: Orthopedic Surgery

## 2014-03-08 DIAGNOSIS — M533 Sacrococcygeal disorders, not elsewhere classified: Principal | ICD-10-CM

## 2014-03-08 DIAGNOSIS — G8929 Other chronic pain: Secondary | ICD-10-CM

## 2014-03-15 ENCOUNTER — Ambulatory Visit
Admission: RE | Admit: 2014-03-15 | Discharge: 2014-03-15 | Disposition: A | Discharge status: Home / Self Care | Payer: 59 | Source: Ambulatory Visit | Attending: Orthopedic Surgery | Admitting: Orthopedic Surgery

## 2014-03-15 DIAGNOSIS — M533 Sacrococcygeal disorders, not elsewhere classified: Principal | ICD-10-CM

## 2014-03-15 DIAGNOSIS — G8929 Other chronic pain: Secondary | ICD-10-CM

## 2014-12-16 IMAGING — CT CT BIOPSY
2 series · 12 of 16 positions shown, 14 images · non-contrast
Comparison: none

CLINICAL DATA: Left sacroiliac joint pain.

[Series 2: si joint injection · axial · 0.70mm/px · z∈[-96,-62]mm · 8 of 52 slices shown, 10 images (1 of 2)]
[im 6/52  soft-tissue]
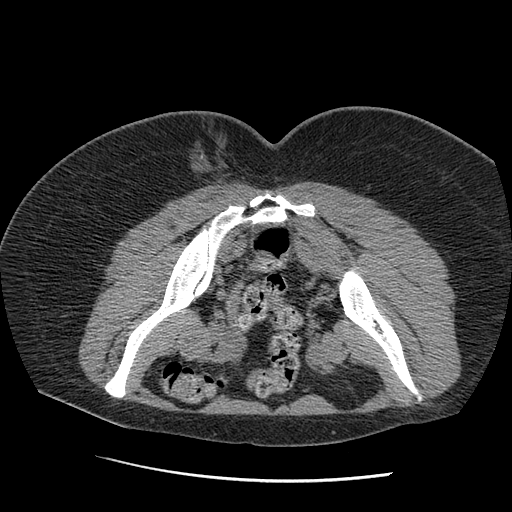
[im 6/52  bone]
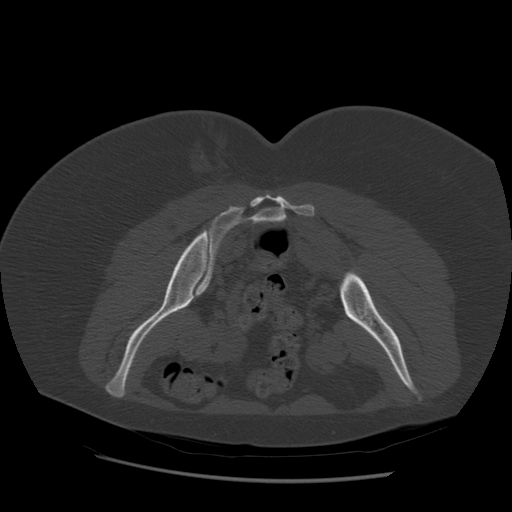
[im 12/52  soft-tissue]
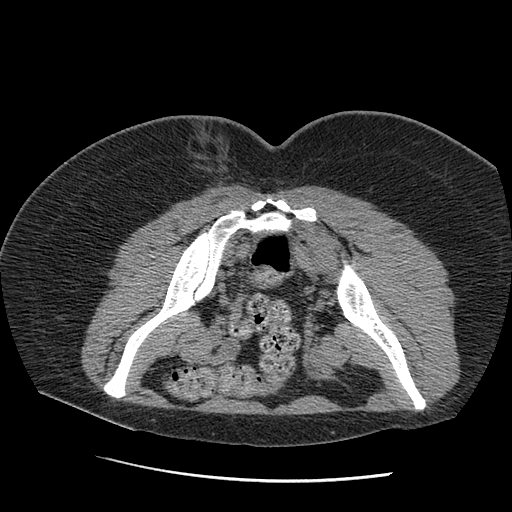
[im 18/52  soft-tissue]
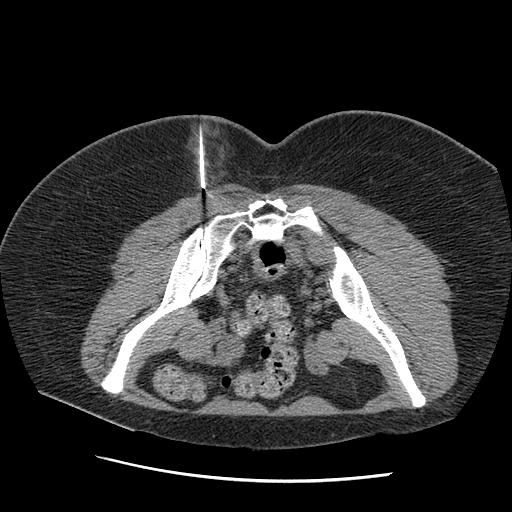
[im 23/52  soft-tissue]
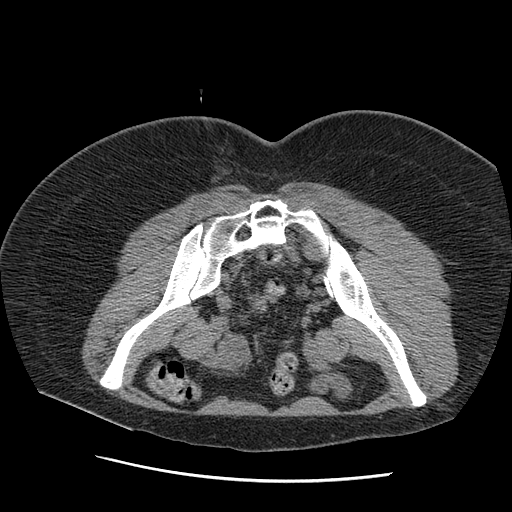
[im 29/52  soft-tissue]
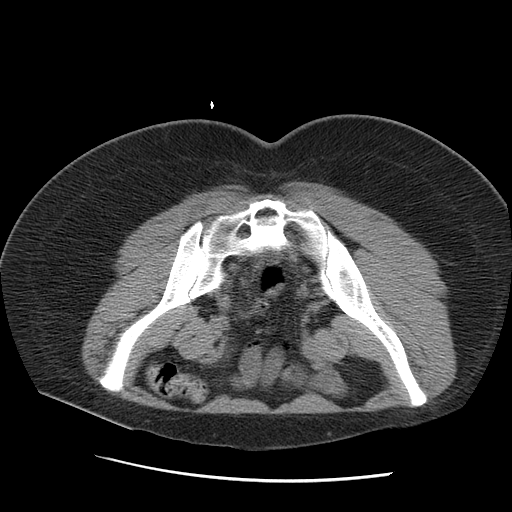
[im 29/52  bone]
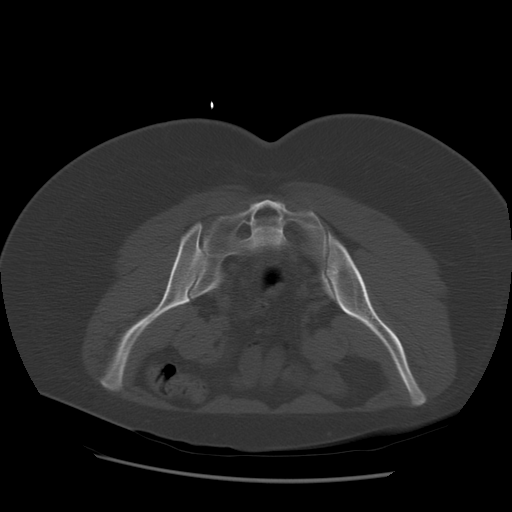
[im 35/52  soft-tissue]
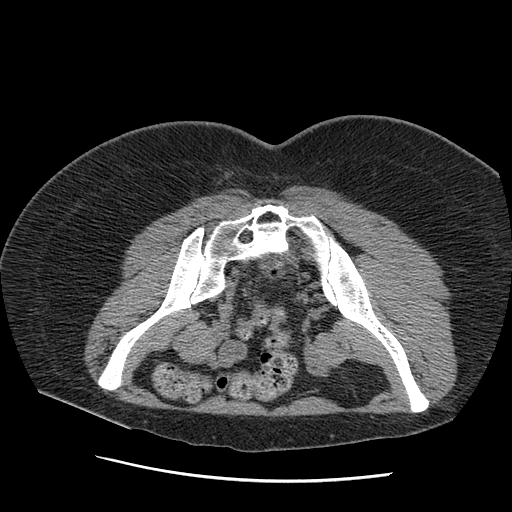
[im 40/52  soft-tissue]
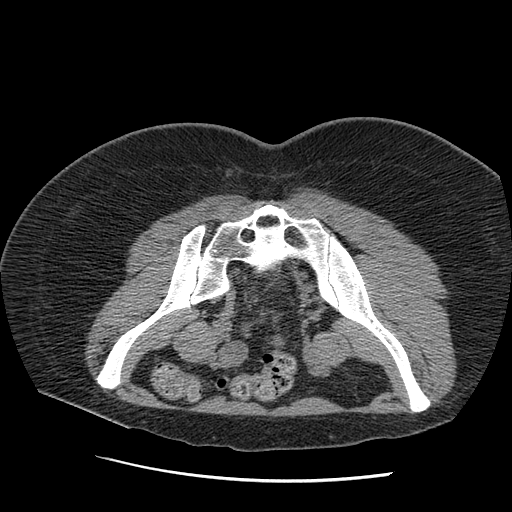
[im 46/52  soft-tissue]
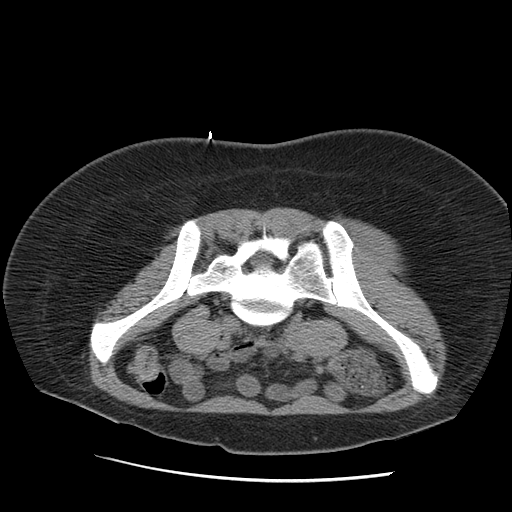

[Series 102: si joint injection · axial · 0.70mm/px · z∈[-97,-50]mm · 4 of 33 slices shown (2 of 2)]
[im 7/33  soft-tissue]
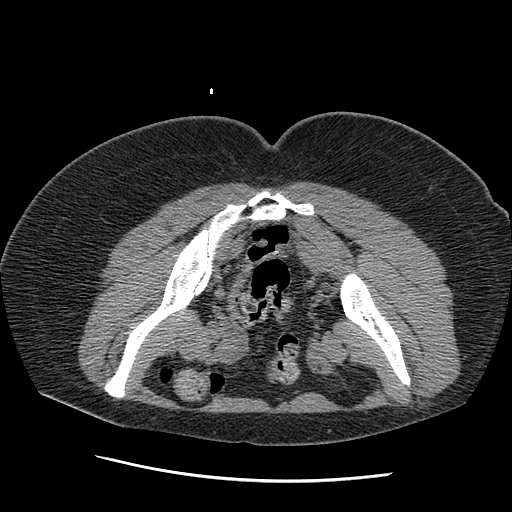
[im 13/33  soft-tissue]
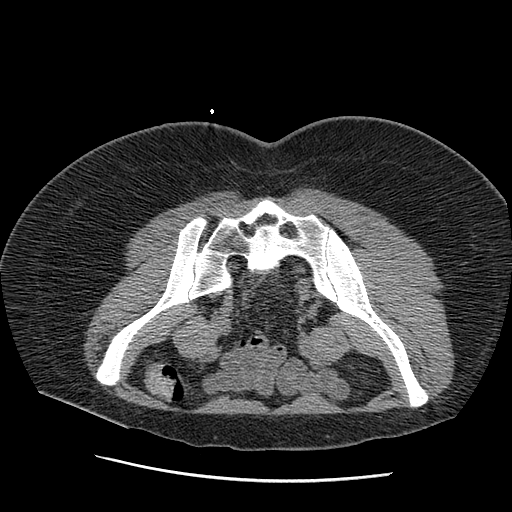
[im 20/33  soft-tissue]
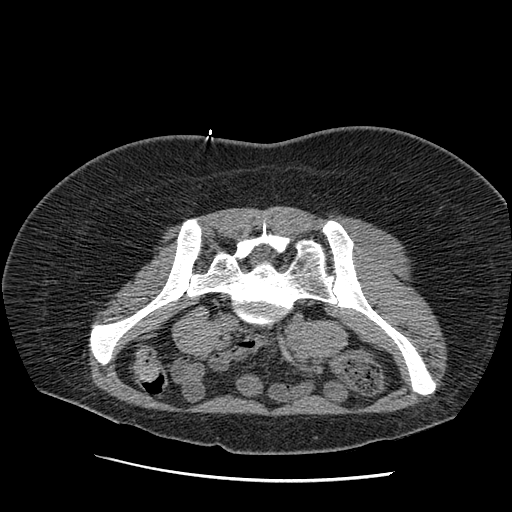
[im 26/33  soft-tissue]
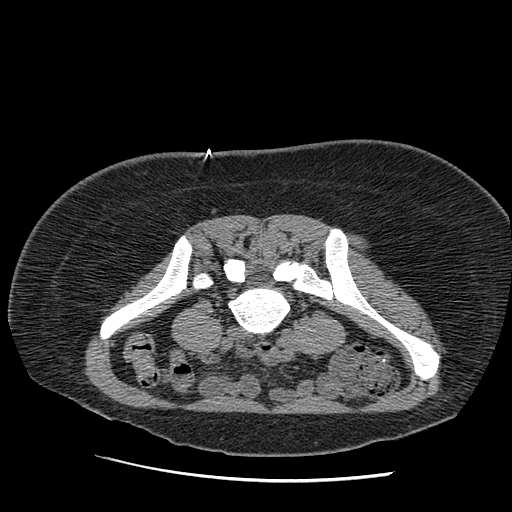

[12 of 16 positions shown; findings below may reference images not displayed]

FLUOROSCOPY TIME:  Not applicable

PROCEDURE:
CT-GUIDED LEFT SI JOINT INJECTION.

After a thorough discussion of risks and benefits of the procedure,
including bleeding, infection, injury to nerves, blood vessels, and
adjacent structures, verbal and written consent was obtained.
Specific risks of the procedure included
nondiagnostic/nontherapeutic injection and non target injection. The
patient was placed prone on the CT table and limited CT imaging was
performed through the SI joints for localization purposes. Target
site marked. The skin was prepped and draped in the usual sterile
fashion using Betadine soap.

After local anesthesia with 1% lidocaine without epinephrine and
subsequent deep anesthesia, a 3.5 inch, 22 gauge spinal needle was
advanced well into the left SI joint under intermittent CT guidance.
Subsequently, 2 mL of 0.5% bupivacaine was injected into SI joint.
Needle removed and a sterile dressing applied.

No complications were observed. The patient was observed and
released under the care of a driver after 30 minutes.
IMPRESSION: Successful CT-guided left SI joint injection.
